# Patient Record
Sex: Female | Born: 1950 | Race: White | Hispanic: No | Marital: Married | Smoking: Current every day smoker
Health system: Southern US, Community
[De-identification: ages and names within clinical notes are randomized; demographics above are authoritative.]

## PROBLEM LIST (undated history)

## (undated) DIAGNOSIS — M2352 Chronic instability of knee, left knee: Secondary | ICD-10-CM

## (undated) DIAGNOSIS — M810 Age-related osteoporosis without current pathological fracture: Secondary | ICD-10-CM

## (undated) HISTORY — PX: TOTAL KNEE ARTHROPLASTY: SHX125

---

## 2013-09-11 HISTORY — PX: BACK SURGERY: SHX140

## 2014-02-27 ENCOUNTER — Emergency Department (HOSPITAL_COMMUNITY): Payer: Commercial Managed Care - PPO

## 2014-02-27 ENCOUNTER — Inpatient Hospital Stay (HOSPITAL_COMMUNITY)
Admission: EM | Admit: 2014-02-27 | Discharge: 2014-03-14 | DRG: 442 | Disposition: A | Discharge status: Skilled Nursing Facility | Payer: Commercial Managed Care - PPO | Attending: General Surgery | Admitting: General Surgery

## 2014-02-27 ENCOUNTER — Encounter (HOSPITAL_COMMUNITY): Payer: Self-pay | Admitting: Emergency Medicine

## 2014-02-27 DIAGNOSIS — F17210 Nicotine dependence, cigarettes, uncomplicated: Secondary | ICD-10-CM | POA: Diagnosis present

## 2014-02-27 DIAGNOSIS — B961 Klebsiella pneumoniae [K. pneumoniae] as the cause of diseases classified elsewhere: Secondary | ICD-10-CM | POA: Diagnosis not present

## 2014-02-27 DIAGNOSIS — R410 Disorientation, unspecified: Secondary | ICD-10-CM

## 2014-02-27 DIAGNOSIS — S36899A Unspecified injury of other intra-abdominal organs, initial encounter: Secondary | ICD-10-CM | POA: Diagnosis not present

## 2014-02-27 DIAGNOSIS — F10931 Alcohol use, unspecified with withdrawal delirium: Secondary | ICD-10-CM | POA: Diagnosis not present

## 2014-02-27 DIAGNOSIS — S2231XA Fracture of one rib, right side, initial encounter for closed fracture: Secondary | ICD-10-CM | POA: Diagnosis present

## 2014-02-27 DIAGNOSIS — F10231 Alcohol dependence with withdrawal delirium: Secondary | ICD-10-CM | POA: Diagnosis not present

## 2014-02-27 DIAGNOSIS — F10239 Alcohol dependence with withdrawal, unspecified: Secondary | ICD-10-CM | POA: Diagnosis not present

## 2014-02-27 DIAGNOSIS — S36116A Major laceration of liver, initial encounter: Secondary | ICD-10-CM

## 2014-02-27 DIAGNOSIS — Y92410 Unspecified street and highway as the place of occurrence of the external cause: Secondary | ICD-10-CM

## 2014-02-27 DIAGNOSIS — N390 Urinary tract infection, site not specified: Secondary | ICD-10-CM | POA: Diagnosis not present

## 2014-02-27 DIAGNOSIS — S2239XA Fracture of one rib, unspecified side, initial encounter for closed fracture: Secondary | ICD-10-CM | POA: Diagnosis not present

## 2014-02-27 DIAGNOSIS — S31109A Unspecified open wound of abdominal wall, unspecified quadrant without penetration into peritoneal cavity, initial encounter: Secondary | ICD-10-CM | POA: Diagnosis present

## 2014-02-27 DIAGNOSIS — S36892A Contusion of other intra-abdominal organs, initial encounter: Secondary | ICD-10-CM | POA: Diagnosis present

## 2014-02-27 DIAGNOSIS — F1010 Alcohol abuse, uncomplicated: Secondary | ICD-10-CM | POA: Diagnosis present

## 2014-02-27 DIAGNOSIS — S36113A Laceration of liver, unspecified degree, initial encounter: Principal | ICD-10-CM | POA: Diagnosis present

## 2014-02-27 DIAGNOSIS — S2232XA Fracture of one rib, left side, initial encounter for closed fracture: Secondary | ICD-10-CM | POA: Diagnosis present

## 2014-02-27 DIAGNOSIS — Z96652 Presence of left artificial knee joint: Secondary | ICD-10-CM | POA: Diagnosis present

## 2014-02-27 DIAGNOSIS — R4182 Altered mental status, unspecified: Secondary | ICD-10-CM | POA: Diagnosis not present

## 2014-02-27 DIAGNOSIS — R000 Tachycardia, unspecified: Secondary | ICD-10-CM | POA: Diagnosis present

## 2014-02-27 DIAGNOSIS — D62 Acute posthemorrhagic anemia: Secondary | ICD-10-CM | POA: Diagnosis not present

## 2014-02-27 DIAGNOSIS — S2243XA Multiple fractures of ribs, bilateral, initial encounter for closed fracture: Secondary | ICD-10-CM | POA: Diagnosis present

## 2014-02-27 DIAGNOSIS — R0781 Pleurodynia: Secondary | ICD-10-CM | POA: Diagnosis present

## 2014-02-27 DIAGNOSIS — S92002A Unspecified fracture of left calcaneus, initial encounter for closed fracture: Secondary | ICD-10-CM | POA: Diagnosis present

## 2014-02-27 HISTORY — DX: Chronic instability of knee, left knee: M23.52

## 2014-02-27 HISTORY — DX: Age-related osteoporosis without current pathological fracture: M81.0

## 2014-02-27 LAB — URINE MICROSCOPIC-ADD ON

## 2014-02-27 LAB — COMPREHENSIVE METABOLIC PANEL
ALT: 614 U/L — ABNORMAL HIGH (ref 0–35)
AST: 1028 U/L — AB (ref 0–37)
Albumin: 3.6 g/dL (ref 3.5–5.2)
Alkaline Phosphatase: 114 U/L (ref 39–117)
Anion gap: 13 (ref 5–15)
BUN: 8 mg/dL (ref 6–23)
CO2: 22 meq/L (ref 19–32)
Calcium: 7.9 mg/dL — ABNORMAL LOW (ref 8.4–10.5)
Chloride: 105 mEq/L (ref 96–112)
Creatinine, Ser: 0.5 mg/dL (ref 0.50–1.10)
Glucose, Bld: 126 mg/dL — ABNORMAL HIGH (ref 70–99)
Potassium: 4.1 mEq/L (ref 3.7–5.3)
Sodium: 140 mEq/L (ref 137–147)
Total Bilirubin: <0.2 mg/dL — ABNORMAL LOW (ref 0.3–1.2)
Total Protein: 6 g/dL (ref 6.0–8.3)

## 2014-02-27 LAB — URINALYSIS, ROUTINE W REFLEX MICROSCOPIC
Bilirubin Urine: NEGATIVE
Glucose, UA: NEGATIVE mg/dL
KETONES UR: NEGATIVE mg/dL
Leukocytes, UA: NEGATIVE
NITRITE: NEGATIVE
PROTEIN: NEGATIVE mg/dL
Specific Gravity, Urine: 1.024 (ref 1.005–1.030)
UROBILINOGEN UA: 0.2 mg/dL (ref 0.0–1.0)
pH: 5 (ref 5.0–8.0)

## 2014-02-27 LAB — CBC WITH DIFFERENTIAL/PLATELET
BASOS ABS: 0 10*3/uL (ref 0.0–0.1)
Basophils Relative: 0 % (ref 0–1)
EOS ABS: 0 10*3/uL (ref 0.0–0.7)
Eosinophils Relative: 0 % (ref 0–5)
HCT: 42.4 % (ref 36.0–46.0)
Hemoglobin: 14.7 g/dL (ref 12.0–15.0)
LYMPHS PCT: 13 % (ref 12–46)
Lymphs Abs: 1.7 10*3/uL (ref 0.7–4.0)
MCH: 31.3 pg (ref 26.0–34.0)
MCHC: 34.7 g/dL (ref 30.0–36.0)
MCV: 90.4 fL (ref 78.0–100.0)
MONO ABS: 0.7 10*3/uL (ref 0.1–1.0)
Monocytes Relative: 5 % (ref 3–12)
Neutro Abs: 11 10*3/uL — ABNORMAL HIGH (ref 1.7–7.7)
Neutrophils Relative %: 82 % — ABNORMAL HIGH (ref 43–77)
Platelets: 208 10*3/uL (ref 150–400)
RBC: 4.69 MIL/uL (ref 3.87–5.11)
RDW: 12.3 % (ref 11.5–15.5)
WBC: 13.4 10*3/uL — ABNORMAL HIGH (ref 4.0–10.5)

## 2014-02-27 LAB — CBC
HEMATOCRIT: 41.8 % (ref 36.0–46.0)
HEMOGLOBIN: 14.9 g/dL (ref 12.0–15.0)
MCH: 32 pg (ref 26.0–34.0)
MCHC: 35.6 g/dL (ref 30.0–36.0)
MCV: 89.7 fL (ref 78.0–100.0)
Platelets: 260 10*3/uL (ref 150–400)
RBC: 4.66 MIL/uL (ref 3.87–5.11)
RDW: 12.5 % (ref 11.5–15.5)
WBC: 13.2 10*3/uL — ABNORMAL HIGH (ref 4.0–10.5)

## 2014-02-27 LAB — PROTIME-INR
INR: 1.14 (ref 0.00–1.49)
PROTHROMBIN TIME: 14.6 s (ref 11.6–15.2)

## 2014-02-27 LAB — TYPE AND SCREEN
ABO/RH(D): O POS
Antibody Screen: NEGATIVE

## 2014-02-27 LAB — RAPID URINE DRUG SCREEN, HOSP PERFORMED
AMPHETAMINES: NOT DETECTED
BARBITURATES: NOT DETECTED
BENZODIAZEPINES: NOT DETECTED
Cocaine: NOT DETECTED
Opiates: POSITIVE — AB
Tetrahydrocannabinol: NOT DETECTED

## 2014-02-27 LAB — ABO/RH: ABO/RH(D): O POS

## 2014-02-27 LAB — ETHANOL: Alcohol, Ethyl (B): 155 mg/dL — ABNORMAL HIGH (ref 0–11)

## 2014-02-27 MED ORDER — INFLUENZA VAC SPLIT QUAD 0.5 ML IM SUSY
0.5000 mL | PREFILLED_SYRINGE | INTRAMUSCULAR | Status: AC
  Administered 2014-02-28: 0.5 mL via INTRAMUSCULAR
  Filled 2014-02-27: qty 0.5

## 2014-02-27 MED ORDER — THIAMINE HCL 100 MG/ML IJ SOLN
100.0000 mg | Freq: Every day | INTRAMUSCULAR | Status: DC
  Filled 2014-02-27 (×5): qty 1

## 2014-02-27 MED ORDER — PANTOPRAZOLE SODIUM 40 MG IV SOLR
40.0000 mg | Freq: Every day | INTRAVENOUS | Status: DC
  Administered 2014-02-27 – 2014-03-01 (×2): 40 mg via INTRAVENOUS
  Filled 2014-02-27 (×5): qty 40

## 2014-02-27 MED ORDER — IOHEXOL 300 MG/ML  SOLN
100.0000 mL | Freq: Once | INTRAMUSCULAR | Status: AC | PRN
  Administered 2014-02-27: 100 mL via INTRAVENOUS

## 2014-02-27 MED ORDER — HYDROMORPHONE HCL 1 MG/ML IJ SOLN
0.5000 mg | INTRAMUSCULAR | Status: DC | PRN
  Administered 2014-02-27 – 2014-03-01 (×10): 0.5 mg via INTRAVENOUS
  Filled 2014-02-27 (×10): qty 1

## 2014-02-27 MED ORDER — TETANUS-DIPHTH-ACELL PERTUSSIS 5-2.5-18.5 LF-MCG/0.5 IM SUSP
0.5000 mL | Freq: Once | INTRAMUSCULAR | Status: AC
  Administered 2014-02-27: 0.5 mL via INTRAMUSCULAR
  Filled 2014-02-27: qty 0.5

## 2014-02-27 MED ORDER — ONDANSETRON HCL 4 MG PO TABS
4.0000 mg | ORAL_TABLET | Freq: Four times a day (QID) | ORAL | Status: DC | PRN
Start: 2014-02-27 — End: 2014-03-14

## 2014-02-27 MED ORDER — VENLAFAXINE HCL 75 MG PO TABS
75.0000 mg | ORAL_TABLET | Freq: Every day | ORAL | Status: DC
  Administered 2014-02-28 – 2014-03-14 (×14): 75 mg via ORAL
  Filled 2014-02-27 (×15): qty 1

## 2014-02-27 MED ORDER — PANTOPRAZOLE SODIUM 40 MG PO TBEC
40.0000 mg | DELAYED_RELEASE_TABLET | Freq: Every day | ORAL | Status: DC
  Administered 2014-02-28 – 2014-03-14 (×13): 40 mg via ORAL
  Filled 2014-02-27 (×13): qty 1

## 2014-02-27 MED ORDER — KCL IN DEXTROSE-NACL 20-5-0.45 MEQ/L-%-% IV SOLN
INTRAVENOUS | Status: DC
Start: 2014-02-27 — End: 2014-02-28
  Administered 2014-02-27: 75 mL/h via INTRAVENOUS
  Administered 2014-02-28: 10:00:00 via INTRAVENOUS
  Filled 2014-02-27 (×4): qty 1000

## 2014-02-27 MED ORDER — OXYCODONE HCL 5 MG PO TABS
10.0000 mg | ORAL_TABLET | ORAL | Status: DC | PRN
  Administered 2014-02-27 – 2014-03-01 (×6): 10 mg via ORAL
  Filled 2014-02-27 (×6): qty 2

## 2014-02-27 MED ORDER — LORAZEPAM 1 MG PO TABS: 1.0000 mg | ORAL_TABLET | Freq: Four times a day (QID) | ORAL | Status: AC | PRN

## 2014-02-27 MED ORDER — ACETAMINOPHEN 325 MG PO TABS
650.0000 mg | ORAL_TABLET | ORAL | Status: DC | PRN
  Administered 2014-03-03 – 2014-03-13 (×3): 650 mg via ORAL
  Filled 2014-02-27 (×3): qty 2

## 2014-02-27 MED ORDER — MORPHINE SULFATE 4 MG/ML IJ SOLN
4.0000 mg | Freq: Once | INTRAMUSCULAR | Status: DC
  Filled 2014-02-27: qty 1

## 2014-02-27 MED ORDER — FOLIC ACID 1 MG PO TABS
1.0000 mg | ORAL_TABLET | Freq: Every day | ORAL | Status: DC
  Administered 2014-02-27 – 2014-03-14 (×15): 1 mg via ORAL
  Filled 2014-02-27 (×16): qty 1

## 2014-02-27 MED ORDER — HYDROMORPHONE HCL 1 MG/ML IJ SOLN
0.5000 mg | Freq: Once | INTRAMUSCULAR | Status: AC
  Administered 2014-02-27: 0.5 mg via INTRAVENOUS
  Filled 2014-02-27: qty 1

## 2014-02-27 MED ORDER — VITAMIN B-1 100 MG PO TABS
100.0000 mg | ORAL_TABLET | Freq: Every day | ORAL | Status: DC
  Administered 2014-02-27 – 2014-03-14 (×15): 100 mg via ORAL
  Filled 2014-02-27 (×16): qty 1

## 2014-02-27 MED ORDER — CETYLPYRIDINIUM CHLORIDE 0.05 % MT LIQD
7.0000 mL | Freq: Two times a day (BID) | OROMUCOSAL | Status: DC
  Administered 2014-02-27 – 2014-03-14 (×30): 7 mL via OROMUCOSAL

## 2014-02-27 MED ORDER — ONDANSETRON HCL 4 MG/2ML IJ SOLN: 4.0000 mg | Freq: Four times a day (QID) | INTRAMUSCULAR | Status: DC | PRN

## 2014-02-27 MED ORDER — LORAZEPAM 2 MG/ML IJ SOLN
0.0000 mg | Freq: Four times a day (QID) | INTRAMUSCULAR | Status: AC
  Administered 2014-02-27: 2 mg via INTRAVENOUS
  Administered 2014-02-28: 1 mg via INTRAVENOUS
  Administered 2014-02-28 (×2): 4 mg via INTRAVENOUS
  Administered 2014-03-01: 1 mg via INTRAVENOUS
  Administered 2014-03-01: 4 mg via INTRAVENOUS
  Filled 2014-02-27 (×3): qty 1
  Filled 2014-02-27: qty 2
  Filled 2014-02-27 (×2): qty 1

## 2014-02-27 MED ORDER — LORAZEPAM 2 MG/ML IJ SOLN
1.0000 mg | Freq: Four times a day (QID) | INTRAMUSCULAR | Status: AC | PRN
  Administered 2014-02-28 – 2014-03-02 (×6): 1 mg via INTRAVENOUS
  Filled 2014-02-27 (×7): qty 1

## 2014-02-27 MED ORDER — ONDANSETRON HCL 4 MG/2ML IJ SOLN
4.0000 mg | Freq: Once | INTRAMUSCULAR | Status: DC
  Filled 2014-02-27: qty 2

## 2014-02-27 MED ORDER — BUTALBITAL-APAP-CAFFEINE 50-325-40 MG PO TABS: 1.0000 | ORAL_TABLET | Freq: Two times a day (BID) | ORAL | Status: DC | PRN

## 2014-02-27 MED ORDER — ADULT MULTIVITAMIN W/MINERALS CH
1.0000 | ORAL_TABLET | Freq: Every day | ORAL | Status: DC
  Administered 2014-02-27 – 2014-03-14 (×14): 1 via ORAL
  Filled 2014-02-27 (×16): qty 1

## 2014-02-27 MED ORDER — LORAZEPAM 2 MG/ML IJ SOLN
0.0000 mg | Freq: Two times a day (BID) | INTRAMUSCULAR | Status: AC
  Administered 2014-03-01: 1 mg via INTRAVENOUS
  Administered 2014-03-02: 2 mg via INTRAVENOUS
  Administered 2014-03-03 (×2): 4 mg via INTRAVENOUS
  Filled 2014-02-27: qty 2
  Filled 2014-02-27 (×3): qty 1
  Filled 2014-02-27: qty 2

## 2014-02-27 MED ORDER — OXYCODONE HCL 5 MG PO TABS
5.0000 mg | ORAL_TABLET | ORAL | Status: DC | PRN
  Administered 2014-02-28: 5 mg via ORAL
  Filled 2014-02-27: qty 1
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
## 2014-02-27 NOTE — ED Notes (Signed)
Pt placed on 2L 02, pt's 02 sats reading 88% on RA.  
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
## 2014-02-27 NOTE — ED Notes (Signed)
Admitting MD at bedside.

## 2014-02-27 NOTE — Progress Notes (Signed)
Orthopedic Tech Progress Note Patient Details:  Brooke Estrada 1951/02/17 [id number]  Ortho Devices Type of Ortho Device: Ace wrap;Post (short leg) splint Ortho Device/Splint Location: lle Ortho Device/Splint Interventions: Application   Brooke Estrada 02/27/2014, 3:42 PM
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
## 2014-02-27 NOTE — ED Notes (Signed)
Pt unable to urinate at this time.  
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
## 2014-02-28 LAB — CBC
HCT: 44.9 % (ref 36.0–46.0)
HCT: 42.9 % (ref 36.0–46.0)
Hemoglobin: 15.3 g/dL — ABNORMAL HIGH (ref 12.0–15.0)
Hemoglobin: 14.7 g/dL (ref 12.0–15.0)
MCH: 31 pg (ref 26.0–34.0)
MCH: 32 pg (ref 26.0–34.0)
MCHC: 34.1 g/dL (ref 30.0–36.0)
MCHC: 34.3 g/dL (ref 30.0–36.0)
MCV: 91.1 fL (ref 78.0–100.0)
MCV: 93.3 fL (ref 78.0–100.0)
Platelets: 198 10*3/uL (ref 150–400)
Platelets: 166 10*3/uL (ref 150–400)
RBC: 4.93 MIL/uL (ref 3.87–5.11)
RBC: 4.6 MIL/uL (ref 3.87–5.11)
RDW: 12.2 % (ref 11.5–15.5)
RDW: 12.2 % (ref 11.5–15.5)
WBC: 14 10*3/uL — ABNORMAL HIGH (ref 4.0–10.5)
WBC: 19.3 10*3/uL — ABNORMAL HIGH (ref 4.0–10.5)

## 2014-02-28 LAB — BASIC METABOLIC PANEL WITH GFR
Anion gap: 10 (ref 5–15)
BUN: 5 mg/dL — ABNORMAL LOW (ref 6–23)
CO2: 27 meq/L (ref 19–32)
Calcium: 8 mg/dL — ABNORMAL LOW (ref 8.4–10.5)
Chloride: 96 meq/L (ref 96–112)
Creatinine, Ser: 0.45 mg/dL — ABNORMAL LOW (ref 0.50–1.10)
GFR calc Af Amer: >90 mL/min
GFR calc non Af Amer: >90 mL/min
Glucose, Bld: 128 mg/dL — ABNORMAL HIGH (ref 70–99)
Potassium: 4.2 meq/L (ref 3.7–5.3)
Sodium: 133 meq/L — ABNORMAL LOW (ref 137–147)

## 2014-02-28 LAB — PROTIME-INR
INR: 1.01 (ref 0.00–1.49)
Prothrombin Time: 13.3 s (ref 11.6–15.2)

## 2014-02-28 MED ORDER — POTASSIUM CHLORIDE 2 MEQ/ML IV SOLN
INTRAVENOUS | Status: DC
  Filled 2014-02-28 (×2): qty 1000

## 2014-02-28 MED ORDER — DEXMEDETOMIDINE HCL IN NACL 200 MCG/50ML IV SOLN
0.2000 ug/kg/h | INTRAVENOUS | Status: AC
  Administered 2014-02-28 (×3): 0.2 ug/kg/h via INTRAVENOUS
  Administered 2014-03-01: 0.108 ug/kg/h via INTRAVENOUS
  Filled 2014-02-28 (×3): qty 50

## 2014-02-28 MED ORDER — METOPROLOL TARTRATE 12.5 MG HALF TABLET
12.5000 mg | ORAL_TABLET | Freq: Two times a day (BID) | ORAL | Status: DC
  Administered 2014-02-28 – 2014-03-14 (×28): 12.5 mg via ORAL
  Filled 2014-02-28 (×31): qty 1

## 2014-02-28 MED ORDER — DEXMEDETOMIDINE HCL IN NACL 200 MCG/50ML IV SOLN
0.4000 ug/kg/h | INTRAVENOUS | Status: DC
  Filled 2014-02-28: qty 50

## 2014-02-28 NOTE — Progress Notes (Addendum)
UR completed. Pt has been evaluated by PT and has no HH recommendations. DME can be provided by [facility] once ordered.   Carlyle Lipa, RN BSN [facility] CCM Trauma/Neuro ICU Case Manager [phone]
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
## 2014-02-28 NOTE — Progress Notes (Signed)
Pt overdue for another CBC. Lab called, will come draw ASAP.
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
## 2014-02-28 NOTE — Progress Notes (Signed)
Patient ID: Brooke Estrada, female   DOB: [birth year], 63 y.o.   MRN: [id number] Worsening ETOH withdrawal. Sweating and hallucinations.Added low dose Precedex with good effect. I spoke with her two daughters and other family at the bedside. [name], MD, MPH, FACS Trauma: [phone] General Surgery: [phone]

## 2014-03-01 LAB — BASIC METABOLIC PANEL
ANION GAP: 10 (ref 5–15)
BUN: 10 mg/dL (ref 6–23)
CALCIUM: 8 mg/dL — AB (ref 8.4–10.5)
CO2: 25 mEq/L (ref 19–32)
CREATININE: 0.54 mg/dL (ref 0.50–1.10)
Chloride: 99 mEq/L (ref 96–112)
Glucose, Bld: 111 mg/dL — ABNORMAL HIGH (ref 70–99)
Potassium: 5.2 mEq/L (ref 3.7–5.3)
Sodium: 134 mEq/L — ABNORMAL LOW (ref 137–147)

## 2014-03-01 LAB — CBC
HCT: 39.9 % (ref 36.0–46.0)
HCT: 36 % (ref 36.0–46.0)
HEMOGLOBIN: 12.2 g/dL (ref 12.0–15.0)
Hemoglobin: 13.5 g/dL (ref 12.0–15.0)
MCH: 30.8 pg (ref 26.0–34.0)
MCH: 31.4 pg (ref 26.0–34.0)
MCHC: 33.8 g/dL (ref 30.0–36.0)
MCHC: 33.9 g/dL (ref 30.0–36.0)
MCV: 92.5 fL (ref 78.0–100.0)
MCV: 90.9 fL (ref 78.0–100.0)
PLATELETS: 150 10*3/uL (ref 150–400)
Platelets: 122 10*3/uL — ABNORMAL LOW (ref 150–400)
RBC: 4.39 MIL/uL (ref 3.87–5.11)
RBC: 3.89 MIL/uL (ref 3.87–5.11)
RDW: 12.1 % (ref 11.5–15.5)
RDW: 12.2 % (ref 11.5–15.5)
WBC: 12.2 10*3/uL — ABNORMAL HIGH (ref 4.0–10.5)
WBC: 16.5 10*3/uL — AB (ref 4.0–10.5)

## 2014-03-01 MED ORDER — TRAMADOL HCL 50 MG PO TABS
50.0000 mg | ORAL_TABLET | Freq: Four times a day (QID) | ORAL | Status: DC | PRN
  Administered 2014-03-01: 75 mg via ORAL
  Administered 2014-03-01 – 2014-03-02 (×2): 50 mg via ORAL
  Administered 2014-03-03 – 2014-03-05 (×4): 100 mg via ORAL
  Administered 2014-03-05 – 2014-03-06 (×2): 50 mg via ORAL
  Administered 2014-03-07 – 2014-03-14 (×12): 100 mg via ORAL
  Filled 2014-03-01 (×2): qty 2
  Filled 2014-03-01: qty 1
  Filled 2014-03-01 (×5): qty 2
  Filled 2014-03-01: qty 1
  Filled 2014-03-01 (×2): qty 2
  Filled 2014-03-01 (×2): qty 1
  Filled 2014-03-01: qty 2
  Filled 2014-03-01: qty 1
  Filled 2014-03-01 (×8): qty 2

## 2014-03-01 MED ORDER — DEXMEDETOMIDINE HCL IN NACL 200 MCG/50ML IV SOLN
0.2000 ug/kg/h | INTRAVENOUS | Status: AC
  Administered 2014-03-01: 0.2 ug/kg/h via INTRAVENOUS
  Administered 2014-03-02: 0.3 ug/kg/h via INTRAVENOUS
  Administered 2014-03-02: 0.2 ug/kg/h via INTRAVENOUS
  Administered 2014-03-03: 0.5 ug/kg/h via INTRAVENOUS
  Administered 2014-03-03: 0.3 ug/kg/h via INTRAVENOUS
  Filled 2014-03-01 (×5): qty 50

## 2014-03-01 MED ORDER — BETHANECHOL CHLORIDE 25 MG PO TABS
25.0000 mg | ORAL_TABLET | Freq: Four times a day (QID) | ORAL | Status: DC
Start: 2014-03-01 — End: 2014-03-13
  Administered 2014-03-01 – 2014-03-12 (×45): 25 mg via ORAL
  Filled 2014-03-01 (×52): qty 1

## 2014-03-01 MED ORDER — DEXTROSE-NACL 5-0.9 % IV SOLN
INTRAVENOUS | Status: DC
  Administered 2014-03-01 (×2): via INTRAVENOUS
  Administered 2014-03-02: 50 mL/h via INTRAVENOUS
  Administered 2014-03-02 – 2014-03-03 (×2): via INTRAVENOUS
  Administered 2014-03-04: 1000 mL via INTRAVENOUS
  Administered 2014-03-04 – 2014-03-05 (×2): via INTRAVENOUS
  Administered 2014-03-07: 50 mL/h via INTRAVENOUS
  Administered 2014-03-08 – 2014-03-10 (×3): via INTRAVENOUS

## 2014-03-01 MED ORDER — HYDROMORPHONE HCL 1 MG/ML IJ SOLN
0.5000 mg | INTRAMUSCULAR | Status: DC | PRN
Start: 2014-03-01 — End: 2014-03-04
  Administered 2014-03-01 – 2014-03-04 (×12): 0.5 mg via INTRAVENOUS
  Filled 2014-03-01 (×12): qty 1

## 2014-03-01 NOTE — Progress Notes (Signed)
Notified Trauma MD about patient's inability to void. I performed multiple bladder scans which I felt not to be accurate due to the patient's anatomy and the amount of intake she was receiving, and placed patient on the bed pan multiple times in an attempt for the patient to void. Patient also complained about having the urge to pee and being uncomfortable. MD ordered a cath and wanted it left in if output was greater than 300. The initial output was 375 ml. The catheter was left in as per MD order. Will continue to monitor.

## 2014-03-01 NOTE — Progress Notes (Signed)
ETOH withdraw an issue.  Cont ICU care.  Appears stable otherwise.

## 2014-03-01 NOTE — Progress Notes (Signed)
Pt restless and removes [place].
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
## 2014-03-02 LAB — COMPREHENSIVE METABOLIC PANEL
ALT: 163 U/L — ABNORMAL HIGH (ref 0–35)
AST: 134 U/L — AB (ref 0–37)
Albumin: 2.5 g/dL — ABNORMAL LOW (ref 3.5–5.2)
Alkaline Phosphatase: 93 U/L (ref 39–117)
Anion gap: 9 (ref 5–15)
BUN: 9 mg/dL (ref 6–23)
CALCIUM: 8.1 mg/dL — AB (ref 8.4–10.5)
CO2: 25 meq/L (ref 19–32)
CREATININE: 0.44 mg/dL — AB (ref 0.50–1.10)
Chloride: 105 mEq/L (ref 96–112)
GFR calc non Af Amer: >90 mL/min (ref >90)
GLUCOSE: 97 mg/dL (ref 70–99)
Potassium: 5.4 mEq/L — ABNORMAL HIGH (ref 3.7–5.3)
Sodium: 139 mEq/L (ref 137–147)
TOTAL PROTEIN: 5.6 g/dL — AB (ref 6.0–8.3)
Total Bilirubin: 0.6 mg/dL (ref 0.3–1.2)

## 2014-03-02 LAB — CBC
HCT: 30.8 % — ABNORMAL LOW (ref 36.0–46.0)
Hemoglobin: 10.5 g/dL — ABNORMAL LOW (ref 12.0–15.0)
MCH: 30.8 pg (ref 26.0–34.0)
MCHC: 34.1 g/dL (ref 30.0–36.0)
MCV: 90.3 fL (ref 78.0–100.0)
PLATELETS: 107 10*3/uL — AB (ref 150–400)
RBC: 3.41 MIL/uL — AB (ref 3.87–5.11)
RDW: 12.1 % (ref 11.5–15.5)
WBC: 8.7 10*3/uL (ref 4.0–10.5)

## 2014-03-02 MED ORDER — SODIUM CHLORIDE 0.9 % IV SOLN
500.0000 mL | Freq: Once | INTRAVENOUS | Status: AC
  Administered 2014-03-02: 500 mL via INTRAVENOUS
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
## 2014-03-02 NOTE — Plan of Care (Signed)
Problem: Phase I Progression Outcomes Goal: Voiding-avoid urinary catheter unless indicated Outcome: Progressing Foley for acute urinary retention, on urecholine & increasing IVFs for hydration

## 2014-03-03 LAB — CBC WITH DIFFERENTIAL/PLATELET
Basophils Absolute: 0 10*3/uL (ref 0.0–0.1)
Basophils Relative: 0 % (ref 0–1)
Eosinophils Absolute: 0.1 10*3/uL (ref 0.0–0.7)
Eosinophils Relative: 1 % (ref 0–5)
HCT: 27.6 % — ABNORMAL LOW (ref 36.0–46.0)
Hemoglobin: 9.5 g/dL — ABNORMAL LOW (ref 12.0–15.0)
LYMPHS ABS: 0.5 10*3/uL — AB (ref 0.7–4.0)
LYMPHS PCT: 8 % — AB (ref 12–46)
MCH: 31 pg (ref 26.0–34.0)
MCHC: 34.4 g/dL (ref 30.0–36.0)
MCV: 90.2 fL (ref 78.0–100.0)
Monocytes Absolute: 0.6 10*3/uL (ref 0.1–1.0)
Monocytes Relative: 8 % (ref 3–12)
NEUTROS PCT: 83 % — AB (ref 43–77)
Neutro Abs: 5.6 10*3/uL (ref 1.7–7.7)
Platelets: 104 10*3/uL — ABNORMAL LOW (ref 150–400)
RBC: 3.06 MIL/uL — AB (ref 3.87–5.11)
RDW: 12.2 % (ref 11.5–15.5)
WBC: 6.8 10*3/uL (ref 4.0–10.5)

## 2014-03-03 LAB — BASIC METABOLIC PANEL
ANION GAP: 11 (ref 5–15)
BUN: 7 mg/dL (ref 6–23)
CO2: 26 mEq/L (ref 19–32)
Calcium: 7.8 mg/dL — ABNORMAL LOW (ref 8.4–10.5)
Chloride: 102 mEq/L (ref 96–112)
Creatinine, Ser: 0.38 mg/dL — ABNORMAL LOW (ref 0.50–1.10)
GFR calc non Af Amer: >90 mL/min (ref >90)
Glucose, Bld: 113 mg/dL — ABNORMAL HIGH (ref 70–99)
POTASSIUM: 3.1 meq/L — AB (ref 3.7–5.3)
SODIUM: 139 meq/L (ref 137–147)

## 2014-03-03 LAB — CBC
HCT: 28.9 % — ABNORMAL LOW (ref 36.0–46.0)
Hemoglobin: 9.9 g/dL — ABNORMAL LOW (ref 12.0–15.0)
MCH: 30.9 pg (ref 26.0–34.0)
MCHC: 34.3 g/dL (ref 30.0–36.0)
MCV: 90.3 fL (ref 78.0–100.0)
Platelets: 107 10*3/uL — ABNORMAL LOW (ref 150–400)
RBC: 3.2 MIL/uL — AB (ref 3.87–5.11)
RDW: 12.2 % (ref 11.5–15.5)
WBC: 6.8 10*3/uL (ref 4.0–10.5)

## 2014-03-03 MED ORDER — DEXMEDETOMIDINE HCL IN NACL 200 MCG/50ML IV SOLN
0.2000 ug/kg/h | INTRAVENOUS | Status: AC
  Administered 2014-03-03: 0.2 ug/kg/h via INTRAVENOUS
  Administered 2014-03-04: 0.4 ug/kg/h via INTRAVENOUS
  Filled 2014-03-03 (×2): qty 50

## 2014-03-03 MED ORDER — POTASSIUM CHLORIDE CRYS ER 20 MEQ PO TBCR
20.0000 meq | EXTENDED_RELEASE_TABLET | Freq: Two times a day (BID) | ORAL | Status: AC
  Administered 2014-03-03 (×2): 20 meq via ORAL
  Filled 2014-03-03 (×2): qty 1

## 2014-03-03 NOTE — Progress Notes (Signed)
Chaplain visited with patient and family. Patient responded sometimes to prompting, but overall was tired. Daughter and husband identified that patient was more aware today than previous days. Family seemed reluctant to share concerns with chaplain. Will follow as needed.   03/03/14 1100  Clinical Encounter Type  Visited With Patient and family together  Visit Type Initial  Referral From Social work  Spiritual Encounters  Spiritual Needs Emotional  Stress Factors  Patient Stress Factors None identified  Family Stress Factors Family relationships;Major life changes  Charmian Muff, Chaplain 11:30 AM 03/03/2014
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
## 2014-03-03 NOTE — Progress Notes (Signed)
Pt yelling , HR 140s. Ativan given per order. Daughter on the way.

## 2014-03-04 ENCOUNTER — Inpatient Hospital Stay (HOSPITAL_COMMUNITY): Payer: Commercial Managed Care - PPO

## 2014-03-04 LAB — BASIC METABOLIC PANEL
Anion gap: 7 (ref 5–15)
BUN: 6 mg/dL (ref 6–23)
CHLORIDE: 105 meq/L (ref 96–112)
CO2: 30 mEq/L (ref 19–32)
CREATININE: 0.45 mg/dL — AB (ref 0.50–1.10)
Calcium: 8.1 mg/dL — ABNORMAL LOW (ref 8.4–10.5)
GFR calc non Af Amer: >90 mL/min (ref >90)
Glucose, Bld: 119 mg/dL — ABNORMAL HIGH (ref 70–99)
POTASSIUM: 3.5 meq/L — AB (ref 3.7–5.3)
SODIUM: 142 meq/L (ref 137–147)

## 2014-03-04 LAB — CBC
HCT: 28 % — ABNORMAL LOW (ref 36.0–46.0)
Hemoglobin: 9.6 g/dL — ABNORMAL LOW (ref 12.0–15.0)
MCH: 31 pg (ref 26.0–34.0)
MCHC: 34.3 g/dL (ref 30.0–36.0)
MCV: 90.3 fL (ref 78.0–100.0)
Platelets: 119 10*3/uL — ABNORMAL LOW (ref 150–400)
RBC: 3.1 MIL/uL — AB (ref 3.87–5.11)
RDW: 12.2 % (ref 11.5–15.5)
WBC: 6.9 10*3/uL (ref 4.0–10.5)

## 2014-03-04 LAB — POCT I-STAT 3, ART BLOOD GAS (G3+)
ACID-BASE EXCESS: 2 mmol/L (ref 0.0–2.0)
Bicarbonate: 27.2 meq/L — ABNORMAL HIGH (ref 20.0–24.0)
O2 SAT: 91 %
PO2 ART: 62 mmHg — AB (ref 80.0–100.0)
TCO2: 28 mmol/L (ref 0–100)
pCO2 arterial: 43.5 mmHg (ref 35.0–45.0)
pH, Arterial: 7.406 (ref 7.350–7.450)

## 2014-03-04 MED ORDER — FUROSEMIDE 10 MG/ML IJ SOLN: 10.0000 mg | Freq: Once | INTRAMUSCULAR | Status: AC

## 2014-03-04 MED ORDER — ALBUTEROL SULFATE (2.5 MG/3ML) 0.083% IN NEBU
2.5000 mg | INHALATION_SOLUTION | RESPIRATORY_TRACT | Status: DC | PRN
  Administered 2014-03-06: 2.5 mg via RESPIRATORY_TRACT
  Filled 2014-03-04: qty 3

## 2014-03-04 MED ORDER — LORAZEPAM 2 MG/ML IJ SOLN
1.0000 mg | INTRAMUSCULAR | Status: DC | PRN
  Administered 2014-03-04 – 2014-03-05 (×4): 2 mg via INTRAVENOUS
  Administered 2014-03-06 (×2): 1 mg via INTRAVENOUS
  Administered 2014-03-06 – 2014-03-10 (×13): 2 mg via INTRAVENOUS
  Administered 2014-03-10: 1 mg via INTRAVENOUS
  Administered 2014-03-11 (×2): 2 mg via INTRAVENOUS
  Filled 2014-03-04 (×23): qty 1

## 2014-03-04 MED ORDER — ALBUTEROL SULFATE (2.5 MG/3ML) 0.083% IN NEBU: 2.5000 mg | INHALATION_SOLUTION | RESPIRATORY_TRACT | Status: AC

## 2014-03-04 MED ORDER — HYDROMORPHONE HCL 1 MG/ML IJ SOLN
0.5000 mg | INTRAMUSCULAR | Status: DC | PRN
  Administered 2014-03-05: 1 mg via INTRAVENOUS
  Administered 2014-03-05: 0.5 mg via INTRAVENOUS
  Administered 2014-03-05 (×2): 1 mg via INTRAVENOUS
  Administered 2014-03-06: 0.5 mg via INTRAVENOUS
  Administered 2014-03-06 – 2014-03-13 (×21): 1 mg via INTRAVENOUS
  Filled 2014-03-04 (×29): qty 1

## 2014-03-04 MED ORDER — HYDROMORPHONE HCL 1 MG/ML IJ SOLN
INTRAMUSCULAR | Status: AC
  Filled 2014-03-04: qty 1

## 2014-03-04 MED ORDER — DEXMEDETOMIDINE HCL IN NACL 200 MCG/50ML IV SOLN
0.2000 ug/kg/h | INTRAVENOUS | Status: DC
  Administered 2014-03-04 (×2): 0.3 ug/kg/h via INTRAVENOUS
  Filled 2014-03-04: qty 50

## 2014-03-04 MED ORDER — IPRATROPIUM BROMIDE 0.02 % IN SOLN: 0.5000 mg | RESPIRATORY_TRACT | Status: DC | PRN

## 2014-03-04 MED ORDER — HYDROMORPHONE HCL 1 MG/ML IJ SOLN
0.5000 mg | INTRAMUSCULAR | Status: AC
  Administered 2014-03-04: 0.5 mg via INTRAVENOUS

## 2014-03-04 MED ORDER — ALBUTEROL SULFATE (2.5 MG/3ML) 0.083% IN NEBU
2.5000 mg | INHALATION_SOLUTION | RESPIRATORY_TRACT | Status: DC
  Administered 2014-03-04 – 2014-03-10 (×36): 2.5 mg via RESPIRATORY_TRACT
  Filled 2014-03-04 (×38): qty 3

## 2014-03-04 MED ORDER — ALBUTEROL SULFATE (2.5 MG/3ML) 0.083% IN NEBU
2.5000 mg | INHALATION_SOLUTION | RESPIRATORY_TRACT | Status: DC | PRN
  Administered 2014-03-04 (×2): 2.5 mg via RESPIRATORY_TRACT
  Filled 2014-03-04 (×2): qty 3

## 2014-03-04 MED ORDER — FUROSEMIDE 10 MG/ML IJ SOLN
INTRAMUSCULAR | Status: AC
  Administered 2014-03-04: 10 mg
  Filled 2014-03-04: qty 4
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
## 2014-03-04 NOTE — Progress Notes (Signed)
Patient is having labored breathing, tachypnea, diaphoretic. Pain medication, dilaudid, given at 1735, and 2 mg of Ativan given at 1803 with no change in condition. Dr. Dwain Sarna paged and ordered CXR, ABG, and breathing treatment to be given now. E-link notified and onboard. Dr. Sung Amabile is aware, has seen patient via in room camera, ordered 1 time dose of dilaudid of 0.5 mg to be given now. Will continue to monitor the patient. Susy Manor, RN

## 2014-03-04 NOTE — Progress Notes (Signed)
RT called to give PRN treatment due to wheezing. Patient had audible wheezing and labored breathing. Post treatment patient seemed to settle down. Told RN to call if needed another PRN around 5pm.
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
## 2014-03-04 NOTE — Progress Notes (Signed)
Trauma Service Note  Subjective: Patient is more responsive today than Monday.  Ate a little breakfast  Objective: Vital signs in last 24 hours: Temp:  [97.4 F (36.3 C)-101.8 F (38.8 C)] 98.4 F (36.9 C) (09/22 0746) Pulse Rate:  [30-108] 96 (09/22 0800) Resp:  [19-38] 31 (09/22 0800) BP: (85-132)/(46-83) 132/78 mmHg (09/22 0800) SpO2:  [88 %-100 %] 96 % (09/22 0800) Last BM Date: 02/27/14  Intake/Output from previous day: 09/21 0701 - 09/22 0700 In: 1282.1 [I.V.:1282.1] Out: 1050 [Urine:1050] Intake/Output this shift: Total I/O In: 55.9 [I.V.:55.9] Out: 35 [Urine:35]  General: Agitated on Precedex drip.  Still on CIWA protocol  Lungs: Clear, but increased respiratory rate.  Abd: Soft, benign  Extremities: Left foot in splint and not to have surgery right away.  Neuro: Agitated, not oriented.  Lab Results: CBC   Recent Labs  03/03/14 1315 03/04/14 0207  WBC 6.8 6.9  HGB 9.9* 9.6*  HCT 28.9* 28.0*  PLT 107* 119*   BMET  Recent Labs  03/03/14 1025 03/04/14 0207  NA 139 142  K 3.1* 3.5*  CL 102 105  CO2 26 30  GLUCOSE 113* 119*  BUN 7 6  CREATININE 0.38* 0.45*  CALCIUM 7.8* 8.1*   PT/INR No results found for this basename: LABPROT, INR,  in the last 72 hours ABG No results found for this basename: PHART, PCO2, PO2, HCO3,  in the last 72 hours  Studies/Results: No results found.  Anti-infectives: Anti-infectives   None      Assessment/Plan: s/p  Keep in unit for now. Renew CIWA protocol   LOS: 5 days   Marta Lamas. Gae Bon, MD, FACS [phone] Trauma Surgeon 03/04/2014

## 2014-03-04 NOTE — Progress Notes (Addendum)
RT called for PRN treatment at 1730. Patient still tachycardic(asymptamatic to treatment) , tachypneic, and labored breathing with wheezes after treatment. Asked RN to page MD. MD ordered ABG. Will continue to monitor.

## 2014-03-04 NOTE — Progress Notes (Signed)
Patient ID: Brooke Estrada, female   DOB: Mar 12, 1951, 63 y.o.   MRN: [id number] Tachypneic, maintaining sats, tachycardic after albuterol now, she is somewhat oriented. Apparently this has happened before according to nursing but this was not relayed to me.  Will restart her precedex as I think that is related, will check cxr, abg ok with low po2, will put on facemask as I think breathing through mouth also.

## 2014-03-05 ENCOUNTER — Inpatient Hospital Stay (HOSPITAL_COMMUNITY): Payer: Commercial Managed Care - PPO

## 2014-03-05 LAB — BLOOD GAS, ARTERIAL
Acid-Base Excess: 6 mmol/L — ABNORMAL HIGH (ref 0.0–2.0)
BICARBONATE: 30 meq/L — AB (ref 20.0–24.0)
Drawn by: 362771
FIO2: 0.35 %
O2 Saturation: 93.5 %
PO2 ART: 67.2 mmHg — AB (ref 80.0–100.0)
Patient temperature: 98.6
TCO2: 31.4 mmol/L (ref 0–100)
pCO2 arterial: 43.6 mmHg (ref 35.0–45.0)
pH, Arterial: 7.453 — ABNORMAL HIGH (ref 7.350–7.450)

## 2014-03-05 LAB — CBC WITH DIFFERENTIAL/PLATELET
BASOS ABS: 0 10*3/uL (ref 0.0–0.1)
BASOS PCT: 0 % (ref 0–1)
EOS ABS: 0 10*3/uL (ref 0.0–0.7)
Eosinophils Relative: 1 % (ref 0–5)
HEMATOCRIT: 28.9 % — AB (ref 36.0–46.0)
HEMOGLOBIN: 9.9 g/dL — AB (ref 12.0–15.0)
Lymphocytes Relative: 11 % — ABNORMAL LOW (ref 12–46)
Lymphs Abs: 0.9 10*3/uL (ref 0.7–4.0)
MCH: 30.7 pg (ref 26.0–34.0)
MCHC: 34.3 g/dL (ref 30.0–36.0)
MCV: 89.5 fL (ref 78.0–100.0)
MONO ABS: 1.5 10*3/uL — AB (ref 0.1–1.0)
MONOS PCT: 19 % — AB (ref 3–12)
Neutro Abs: 5.6 10*3/uL (ref 1.7–7.7)
Neutrophils Relative %: 70 % (ref 43–77)
Platelets: 175 10*3/uL (ref 150–400)
RBC: 3.23 MIL/uL — ABNORMAL LOW (ref 3.87–5.11)
RDW: 12.2 % (ref 11.5–15.5)
WBC: 8.1 10*3/uL (ref 4.0–10.5)

## 2014-03-05 LAB — TROPONIN I: Troponin I: <0.3 ng/mL (ref <0.30)

## 2014-03-05 NOTE — Progress Notes (Signed)
Chaplain followed up with pt and family. Pt was able to communicate clearer today. Husband appears to be more positive about wife's progress. He said that she "ate more [at lunch] than all day yesterday," and "wants to go home." He sees the desire for her to go home as a very positive step. Chaplain will follow up as needed.   03/05/14 1300  Clinical Encounter Type  Visited With Patient and family together  Visit Type Follow-up  Spiritual Encounters  Spiritual Needs Emotional  Stress Factors  Patient Stress Factors Exhausted  Family Stress Factors Health changes;Family relationships  Vraj Denardo, Loa Socks 03/05/2014 1:36 PM

## 2014-03-05 NOTE — Progress Notes (Signed)
Trauma Service Note  Subjective: Patient very agitated, but more alert and wanting to go home.  Has wrist restraints in place and mittens.  Objective: Vital signs in last 24 hours: Temp:  [97.5 F (36.4 C)-99.4 F (37.4 C)] 99.1 F (37.3 C) (09/23 0700) Pulse Rate:  [84-124] 103 (09/23 0900) Resp:  [18-39] 26 (09/23 0900) BP: (87-156)/(51-88) 105/55 mmHg (09/23 0900) SpO2:  [93 %-100 %] 96 % (09/23 0900) FiO2 (%):  [35 %] 35 % (09/23 0800) Last BM Date: 02/27/14  Intake/Output from previous day: 09/22 0701 - 09/23 0700 In: 1945.5 [P.O.:720; I.V.:1225.5] Out: 2245 [Urine:2245] Intake/Output this shift: Total I/O In: 100 [I.V.:100] Out: 150 [Urine:150]  General: Agitated, but more alert.  No acute distress.  Lungs: Clear to auscultation.  Abd: Benign  Extremities: No changes  Neuro: Agitated and disoriented.`  Lab Results: CBC   Recent Labs  03/04/14 0207 03/05/14 0253  WBC 6.9 8.1  HGB 9.6* 9.9*  HCT 28.0* 28.9*  PLT 119* 175   BMET  Recent Labs  03/03/14 1025 03/04/14 0207  NA 139 142  K 3.1* 3.5*  CL 102 105  CO2 26 30  GLUCOSE 113* 119*  BUN 7 6  CREATININE 0.38* 0.45*  CALCIUM 7.8* 8.1*   PT/INR No results found for this basename: LABPROT, INR,  in the last 72 hours ABG  Recent Labs  03/04/14 1850 03/05/14 0319  PHART 7.406 7.453*  HCO3 27.2* 30.0*    Studies/Results: Dg Chest Port 1 View  03/04/2014   CLINICAL DATA:  Diaphoretic, tachypnea  EXAM: PORTABLE CHEST - 1 VIEW  COMPARISON:  CT chest 02/27/2014  FINDINGS: Bilateral interstitial thickening. Low lung volumes. No pleural effusion or pneumothorax. Stable cardiomediastinal silhouette. Unremarkable osseous structures.  IMPRESSION: Bilateral interstitial thickening and prominence of the central pulmonary vasculature. Overall findings are concerning for mild pulmonary edema.   Electronically Signed   By: Elige Ko   On: 03/04/2014 19:34    Anti-infectives: Anti-infectives   None      Assessment/Plan: s/p  Advance diet PT and OT.  DC Precedex.  LOS: 6 days   Marta Lamas. Gae Bon, MD, FACS [phone] Trauma Surgeon 03/05/2014
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
## 2014-03-06 ENCOUNTER — Inpatient Hospital Stay (HOSPITAL_COMMUNITY): Payer: Commercial Managed Care - PPO
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
## 2014-03-06 NOTE — Progress Notes (Signed)
Chaplain followed up with pt and family. Chaplain met second daughter. Pt in pain so chaplain opted to let her rest. Will follow as needed.   03/06/14 1100  Clinical Encounter Type  Visited With Patient and family together  Visit Type Follow-up  Brooke Estrada, Barbette Hair, Chaplain 03/06/2014 11:02 AM
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
## 2014-03-06 NOTE — Progress Notes (Signed)
UR completed.  Lexxie Winberg, RN BSN MHA CCM Trauma/Neuro ICU Case Manager [phone]  

## 2014-03-06 NOTE — Progress Notes (Signed)
pts husband requested a PRN tx due tachypnea

## 2014-03-07 ENCOUNTER — Inpatient Hospital Stay (HOSPITAL_COMMUNITY): Payer: Commercial Managed Care - PPO

## 2014-03-07 ENCOUNTER — Encounter (HOSPITAL_COMMUNITY): Payer: Self-pay | Admitting: Neurology

## 2014-03-07 DIAGNOSIS — R404 Transient alteration of awareness: Secondary | ICD-10-CM

## 2014-03-07 DIAGNOSIS — S36115A Moderate laceration of liver, initial encounter: Secondary | ICD-10-CM

## 2014-03-07 DIAGNOSIS — R4182 Altered mental status, unspecified: Secondary | ICD-10-CM | POA: Diagnosis not present

## 2014-03-07 LAB — CBC
HCT: 29.6 % — ABNORMAL LOW (ref 36.0–46.0)
HEMOGLOBIN: 10 g/dL — AB (ref 12.0–15.0)
MCH: 30.5 pg (ref 26.0–34.0)
MCHC: 33.8 g/dL (ref 30.0–36.0)
MCV: 90.2 fL (ref 78.0–100.0)
Platelets: 373 10*3/uL (ref 150–400)
RBC: 3.28 MIL/uL — AB (ref 3.87–5.11)
RDW: 12.6 % (ref 11.5–15.5)
WBC: 13.6 10*3/uL — AB (ref 4.0–10.5)

## 2014-03-07 LAB — BASIC METABOLIC PANEL
ANION GAP: 12 (ref 5–15)
BUN: 7 mg/dL (ref 6–23)
CHLORIDE: 103 meq/L (ref 96–112)
CO2: 32 mEq/L (ref 19–32)
Calcium: 8.3 mg/dL — ABNORMAL LOW (ref 8.4–10.5)
Creatinine, Ser: 0.39 mg/dL — ABNORMAL LOW (ref 0.50–1.10)
GFR calc non Af Amer: >90 mL/min (ref >90)
Glucose, Bld: 128 mg/dL — ABNORMAL HIGH (ref 70–99)
POTASSIUM: 3 meq/L — AB (ref 3.7–5.3)
SODIUM: 147 meq/L (ref 137–147)

## 2014-03-07 MED ORDER — FUROSEMIDE 10 MG/ML IJ SOLN
20.0000 mg | Freq: Once | INTRAMUSCULAR | Status: AC
  Administered 2014-03-07: 20 mg via INTRAVENOUS
  Filled 2014-03-07: qty 2

## 2014-03-07 MED ORDER — POTASSIUM CHLORIDE CRYS ER 20 MEQ PO TBCR
20.0000 meq | EXTENDED_RELEASE_TABLET | Freq: Once | ORAL | Status: AC
  Administered 2014-03-07: 20 meq via ORAL
  Filled 2014-03-07: qty 1

## 2014-03-07 MED ORDER — POTASSIUM CHLORIDE CRYS ER 20 MEQ PO TBCR
20.0000 meq | EXTENDED_RELEASE_TABLET | Freq: Two times a day (BID) | ORAL | Status: AC
  Administered 2014-03-07 (×2): 20 meq via ORAL
  Filled 2014-03-07 (×2): qty 1

## 2014-03-07 MED ORDER — SPIRITUS FRUMENTI
1.0000 | Freq: Two times a day (BID) | ORAL | Status: DC
  Administered 2014-03-07 – 2014-03-11 (×2): 1 via ORAL
  Filled 2014-03-07 (×13): qty 1

## 2014-03-07 MED ORDER — ENSURE COMPLETE PO LIQD
237.0000 mL | Freq: Three times a day (TID) | ORAL | Status: DC
  Administered 2014-03-08 – 2014-03-14 (×12): 237 mL via ORAL

## 2014-03-07 NOTE — Progress Notes (Signed)
At 07:38, pt was extremely agitated, RR in 50s, inconsolable. Ativan and dilaudid given.
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
## 2014-03-07 NOTE — Progress Notes (Signed)
SLP Cancellation Note  Patient Details Name: Brooke Estrada MRN: [id number] DOB: 1950/12/10   Cancelled treatment:       Reason Eval/Treat Not Completed: Other (comment). Pt asleep upon SLP arrival to assess tolerance and readiness for solid advancement. Per RN, pt's cognition and fluctuating RR continues to impact her ability to safely take in PO this morning, and would likely not be ready to advance her solids at this time. Per discussion with RN, will hold off at this time to let patient rest; SLP reviewed pt's risk factors.    Maxcine Ham, M.A. CCC-SLP [phone]  Maxcine Ham 03/07/2014, 2:01 PM
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
## 2014-03-07 NOTE — Clinical Documentation Improvement (Signed)
Abnormal Lab and/or Testing Results: Normal Potassium 3.7-5.3 9/18 = 4.2 9/19 = 5.2 9/20=5.4 9/21=3.1 9/22=3.5 9/25= 3.0   Treatment provided: 9/25 progress note:FEN - encourage PO, replace K Monitored BMP Potassium chloride SA bid starting 9/25  Possible Clinical Conditions:  Hypokalemia Other Unable to determine  Thank you, Cam Harnden T. Luiz Ochoa, MSN, MBA/MHA Clinical Documentation Specialist [email or web address] Office # [phone]

## 2014-03-07 NOTE — Progress Notes (Signed)
Pt. Refused to finish her breathing tx. Pt. Pulled her mask off. RN notified.
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
## 2014-03-08 LAB — CBC
HCT: 30.2 % — ABNORMAL LOW (ref 36.0–46.0)
HEMOGLOBIN: 10.2 g/dL — AB (ref 12.0–15.0)
MCH: 30.7 pg (ref 26.0–34.0)
MCHC: 33.8 g/dL (ref 30.0–36.0)
MCV: 91 fL (ref 78.0–100.0)
Platelets: 473 10*3/uL — ABNORMAL HIGH (ref 150–400)
RBC: 3.32 MIL/uL — ABNORMAL LOW (ref 3.87–5.11)
RDW: 12.8 % (ref 11.5–15.5)
WBC: 16.4 10*3/uL — ABNORMAL HIGH (ref 4.0–10.5)

## 2014-03-08 LAB — BASIC METABOLIC PANEL
ANION GAP: 10 (ref 5–15)
BUN: 7 mg/dL (ref 6–23)
CHLORIDE: 101 meq/L (ref 96–112)
CO2: 35 mEq/L — ABNORMAL HIGH (ref 19–32)
CREATININE: 0.44 mg/dL — AB (ref 0.50–1.10)
Calcium: 8.6 mg/dL (ref 8.4–10.5)
Glucose, Bld: 125 mg/dL — ABNORMAL HIGH (ref 70–99)
Potassium: 3 mEq/L — ABNORMAL LOW (ref 3.7–5.3)
Sodium: 146 mEq/L (ref 137–147)

## 2014-03-08 LAB — CORTISOL: CORTISOL PLASMA: 29.7 ug/dL

## 2014-03-08 LAB — VITAMIN B12: Vitamin B-12: 1161 pg/mL — ABNORMAL HIGH (ref 211–911)

## 2014-03-08 LAB — TSH: TSH: 2.08 u[IU]/mL (ref 0.350–4.500)

## 2014-03-08 MED ORDER — POTASSIUM CHLORIDE CRYS ER 20 MEQ PO TBCR: 20.0000 meq | EXTENDED_RELEASE_TABLET | Freq: Two times a day (BID) | ORAL | Status: DC

## 2014-03-08 MED ORDER — FUROSEMIDE 10 MG/ML IJ SOLN
20.0000 mg | Freq: Once | INTRAMUSCULAR | Status: AC
  Administered 2014-03-08: 20 mg via INTRAVENOUS

## 2014-03-08 MED ORDER — FUROSEMIDE 10 MG/ML IJ SOLN
INTRAMUSCULAR | Status: AC
  Filled 2014-03-08: qty 4

## 2014-03-08 MED ORDER — POTASSIUM CHLORIDE CRYS ER 20 MEQ PO TBCR
20.0000 meq | EXTENDED_RELEASE_TABLET | Freq: Three times a day (TID) | ORAL | Status: DC
  Administered 2014-03-08 – 2014-03-11 (×12): 20 meq via ORAL
  Filled 2014-03-08 (×15): qty 1
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
## 2014-03-09 LAB — CBC
HCT: 28.4 % — ABNORMAL LOW (ref 36.0–46.0)
Hemoglobin: 9.2 g/dL — ABNORMAL LOW (ref 12.0–15.0)
MCH: 30 pg (ref 26.0–34.0)
MCHC: 32.4 g/dL (ref 30.0–36.0)
MCV: 92.5 fL (ref 78.0–100.0)
PLATELETS: 507 10*3/uL — AB (ref 150–400)
RBC: 3.07 MIL/uL — AB (ref 3.87–5.11)
RDW: 13.1 % (ref 11.5–15.5)
WBC: 12.7 10*3/uL — ABNORMAL HIGH (ref 4.0–10.5)

## 2014-03-09 LAB — BASIC METABOLIC PANEL
ANION GAP: 8 (ref 5–15)
BUN: 8 mg/dL (ref 6–23)
CO2: 34 mEq/L — ABNORMAL HIGH (ref 19–32)
Calcium: 8.2 mg/dL — ABNORMAL LOW (ref 8.4–10.5)
Chloride: 103 mEq/L (ref 96–112)
Creatinine, Ser: 0.37 mg/dL — ABNORMAL LOW (ref 0.50–1.10)
Glucose, Bld: 130 mg/dL — ABNORMAL HIGH (ref 70–99)
POTASSIUM: 3.6 meq/L — AB (ref 3.7–5.3)
Sodium: 145 mEq/L (ref 137–147)

## 2014-03-09 MED ORDER — POTASSIUM CHLORIDE CRYS ER 20 MEQ PO TBCR: 40.0000 meq | EXTENDED_RELEASE_TABLET | Freq: Two times a day (BID) | ORAL | Status: DC
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
## 2014-03-10 ENCOUNTER — Inpatient Hospital Stay (HOSPITAL_COMMUNITY): Payer: Commercial Managed Care - PPO

## 2014-03-10 NOTE — Progress Notes (Signed)
Subjective: Patient continues to slowly improve.  Patient working with therapy this morning.  Continues to require reorientation but knows her allergies, details about her grandson, etc.    Objective: Current vital signs: BP 144/80  Pulse 103  Temp(Src) 98 F (36.7 C) (Oral)  Resp 33  Ht  (1.575 m)  Wt 62.2 kg (137 lb 2 oz)  BMI 25.07 kg/m2  SpO2 97% Vital signs in last 24 hours: Temp:  [97.6 F (36.4 C)-99.4 F (37.4 C)] 98 F (36.7 C) (09/28 0800) Pulse Rate:  [91-109] 103 (09/28 0700) Resp:  [19-45] 33 (09/28 0700) BP: (128-156)/(66-101) 144/80 mmHg (09/28 0751) SpO2:  [93 %-98 %] 97 % (09/28 0931) Weight:  [62.2 kg (137 lb 2 oz)] 62.2 kg (137 lb 2 oz) (09/28 0400)  Intake/Output from previous day: 09/27 0701 - 09/28 0700 In: 1290 [P.O.:90; I.V.:1200] Out: 2625 [Urine:2625] Intake/Output this shift:   Nutritional status: Dysphagia  Neurologic Exam: Mental Status:  Alert, requires reorientation, less anxious. Speech fluent without aphasia. Able to follow 3 step commands without difficulty.  Cranial Nerves:  II: Discs flat bilaterally; Visual fields grossly normal, pupils equal, round, reactive to light and accommodation  III,IV, VI: ptosis not present, extra-ocular motions intact bilaterally  V,VII: smile symmetric, facial light touch sensation normal bilaterally  VIII: hearing normal bilaterally  IX,X: gag reflex present  XI: bilateral shoulder shrug  XII: midline tongue extension without atrophy or fasciculations  Motor:  Although bradykinetic moves all extremities against gravity Sensory: Pinprick and light touch intact throughout, bilaterally  Deep Tendon Reflexes:  2+ in the upper extremities and 1+ at the knees  Plantars:  Right: downgoing   Left: in cast   Lab Results: Basic Metabolic Panel:  Recent Labs Lab 03/04/14 0207 03/07/14 0433 03/08/14 0258 03/09/14 0253  NA 142 147 146 145  K 3.5* 3.0* 3.0* 3.6*  CL 105 103 101 103  CO2 30 32  35* 34*  GLUCOSE 119* 128* 125* 130*  BUN CREATININE 0.45* 0.39* 0.44* 0.37*  CALCIUM 8.1* 8.3* 8.6 8.2*    Liver Function Tests: No results found for this basename: AST, ALT, ALKPHOS, BILITOT, PROT, ALBUMIN,  in the last 168 hours No results found for this basename: LIPASE, AMYLASE,  in the last 168 hours No results found for this basename: AMMONIA,  in the last 168 hours  CBC:  Recent Labs Lab 03/04/14 0207 03/05/14 0253 03/07/14 0433 03/08/14 0258 03/09/14 0253  WBC 6.9 8.1 13.6* 16.4* 12.7*  NEUTROABS  --  5.6  --   --   --   HGB 9.6* 9.9* 10.0* 10.2* 9.2*  HCT 28.0* 28.9* 29.6* 30.2* 28.4*  MCV 90.3 89.5 90.2 91.0 92.5  PLT 119* 175 373 473* 507*    Cardiac Enzymes:  Recent Labs Lab 03/05/14 0341  TROPONINI <0.30    Lipid Panel: No results found for this basename: CHOL, TRIG, HDL, CHOLHDL, VLDL, LDLCALC,  in the last 168 hours  CBG: No results found for this basename: GLUCAP,  in the last 168 hours  Microbiology: Results for orders placed during the hospital encounter of 02/27/14  URINE CULTURE     Status: None   Collection Time    03/08/14 11:55 AM      Result Value Ref Range Status   Specimen Description URINE, CATHETERIZED   Final   Special Requests Normal   Final   Culture  Setup Time     Final   Value: 03/08/2014  17:48     Performed at [facility] Count     Final   Value: >=100,000 COLONIES/ML     Performed at [facility]   Culture     Final   Value: GRAM NEGATIVE RODS     Performed at [facility]   Report Status PENDING   Incomplete    Coagulation Studies: No results found for this basename: LABPROT, INR,  in the last 72 hours  Imaging: No results found.  Medications:  I have reviewed the patient's current medications. Scheduled: . albuterol  2.5 mg Nebulization Q4H  . antiseptic oral rinse  7 mL Mouth Rinse BID  . bethanechol  25 mg Oral QID  . feeding supplement (ENSURE COMPLETE)  237  mL Oral TID WC  . folic acid  1 mg Oral Daily  . metoprolol tartrate  12.5 mg Oral BID  . multivitamin with minerals  1 tablet Oral Daily  . pantoprazole  40 mg Oral Daily  . potassium chloride  20 mEq Oral TID  . spiritus frumenti  1 each Oral BID  . thiamine  100 mg Oral Daily  . venlafaxine  75 mg Oral Daily    Assessment/Plan: Patient continues to slowly improve.  Patient's mental status likely multifactorial, related to withdrawal, medications, concussion, infection, etc. AM Cortisol elevated.    Recommendations: 1.  Agree with current management.     LOS: 11 days   Thana Farr, MD [facility] [phone] 03/10/2014  10:29 AM
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
## 2014-03-10 NOTE — Progress Notes (Signed)
Chaplain continues to follow up with family. Family seems positive at the possibility of moving to a step-down unit. Chaplain will follow as needed.  03/10/14 1600  Clinical Encounter Type  Visited With Family  Visit Type Follow-up  Spiritual Encounters  Spiritual Needs Emotional  Stress Factors  Family Stress Factors Major life changes  Natlie Asfour, Mayer Masker, Chaplain 03/10/2014 4:55 PM
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
## 2014-03-10 NOTE — Clinical Social Work Note (Signed)
Clinical Social Work Department BRIEF PSYCHOSOCIAL ASSESSMENT 03/10/2014  Patient:  Brooke Estrada, Brooke Estrada     Account Number:  [id number]     Admit date:  02/27/2014  Clinical Social Worker:  Verl Blalock  Date/Time:  03/10/2014 10:00 AM  Referred by:  Physician  Date Referred:  03/10/2014 Referred for  SNF Placement  Substance Abuse   Other Referral:   Interview type:  Family Other interview type:   Patient oriented to self only at this time - spoke with patient husband at bedside    PSYCHOSOCIAL DATA Living Status:  HUSBAND Admitted from facility:   Level of care:   Primary support name:  John, Vasconcelos   [phone] Primary support relationship to patient:  SPOUSE Degree of support available:   Strong    CURRENT CONCERNS Current Concerns  Post-Acute Placement  Substance Abuse   Other Concerns:    SOCIAL WORK ASSESSMENT / PLAN Clinical Social Worker spoke with patient husband over the phone to offer support and discuss patient needs at discharge.  Patient husband states that patient lives at home with him and he would like for her to return at discharge.  Patient states that he will not be able to manage patient at home until she is at a walker level - home not wheelchair accessible.  Patient husband is agreeable with SNF placement prior to return home.  Patient husband requests placement in [place] area if possible.  CSW initiated SNF search and will follow up with patient husband regarding bed offers.    Clinical Social Worker inquired about current substance use.  Patient is a chronic alcohol user, however not oriented enough at this time to benefit from SBIRT completion.  CSW remains available for support and to facilitate patient discharge needs once medically ready.   Assessment/plan status:  Psychosocial Support/Ongoing Assessment of Needs Other assessment/ plan:   Information/referral to community resources:   Clinical Social Worker offered to leave  facility list at patient bedside, however patient husband declined.    PATIENT'S/FAMILY'S RESPONSE TO PLAN OF CARE: Patient alert to person only at this time.  Patient husband available at bedside intermittently but was available by phone.  Patient with good family support available as needed.  Patient husband agreeable with SNF placement if needed prior to discharge home.  Patient husband verbalized understanding of CSW role and appreciation for support.

## 2014-03-10 NOTE — Progress Notes (Signed)
Rehab Admissions Coordinator Note:  Patient was screened by Trish Mage for appropriateness for an Inpatient Acute Rehab Consult.  At this time, we are recommending Skilled Nursing Facility.  Trish Mage 03/10/2014, 12:28 PM  I can be reached at [phone].

## 2014-03-10 NOTE — Progress Notes (Signed)
Trauma Service Note  Subjective: Patient is more conversant today than before.  Still seems a bit disoriented.    Objective: Vital signs in last 24 hours: Temp:  [97.6 F (36.4 C)-99.4 F (37.4 C)] 98 F (36.7 C) (09/28 0800) Pulse Rate:  [91-109] 103 (09/28 0700) Resp:  [19-45] 33 (09/28 0700) BP: (120-156)/(66-101) 144/80 mmHg (09/28 0751) SpO2:  [93 %-98 %] 95 % (09/28 0700) Weight:  [62.2 kg (137 lb 2 oz)] 62.2 kg (137 lb 2 oz) (09/28 0400) Last BM Date: 02/27/14  Intake/Output from previous day: 09/27 0701 - 09/28 0700 In: 1290 [P.O.:90; I.V.:1200] Out: 2625 [Urine:2625] Intake/Output this shift:    General: No acute distress.  Lungs: Clear.  Paradoxical movement of her sternal area.  Moves air well.  Sats are good on 4 L.  Abd: Soft, good bowel sounds.  Eating a fair amount  Extremities: No changes  Neuro: Confusion but more alert than before.  Lab Results: CBC   Recent Labs  03/08/14 0258 03/09/14 0253  WBC 16.4* 12.7*  HGB 10.2* 9.2*  HCT 30.2* 28.4*  PLT 473* 507*   BMET  Recent Labs  03/08/14 0258 03/09/14 0253  NA 146 145  K 3.0* 3.6*  CL 101 103  CO2 35* 34*  GLUCOSE 125* 130*  BUN 7 8  CREATININE 0.44* 0.37*  CALCIUM 8.6 8.2*   PT/INR No results found for this basename: LABPROT, INR,  in the last 72 hours ABG No results found for this basename: PHART, PCO2, PO2, HCO3,  in the last 72 hours  Studies/Results: No results found.  Anti-infectives: Anti-infectives   None      Assessment/Plan: s/p  d/c foley Advance diet  LOS: 11 days   Marta Lamas. Gae Bon, MD, FACS [phone] Trauma Surgeon 03/10/2014

## 2014-03-10 NOTE — Procedures (Signed)
Supervised and reviewed by Amanii Snethen MA CCC-SLP  

## 2014-03-10 NOTE — Progress Notes (Signed)
OT Cancellation Note  Patient Details Name: Brooke Estrada MRN: [id number] DOB: 1950-12-31   Cancelled Treatment:    Reason Eval/Treat Not Completed: Patient at procedure or test/ unavailable (MBS)  [facility] [place] Volante, OTR/L  [phone] 03/10/2014 03/10/2014, 1:15 PM

## 2014-03-10 NOTE — Progress Notes (Signed)
Report called to 3S xray also called coming up to get patient.i transfer patient to 3S from xray

## 2014-03-10 NOTE — Clinical Social Work Placement (Addendum)
Clinical Social Work Department CLINICAL SOCIAL WORK PLACEMENT NOTE 03/10/2014  Patient:  Brooke Estrada, Brooke Estrada  Account Number:  [id number] Admit date:  02/27/2014  Clinical Social Worker:  Macario Golds, LCSW  Date/time:  03/10/2014 10:00 AM  Clinical Social Work is seeking post-discharge placement for this patient at the following level of care:   SKILLED NURSING   (*CSW will update this form in Epic as items are completed)   03/10/2014  Patient/family provided with [facility] Department of Clinical Social Work's list of facilities offering this level of care within the geographic area requested by the patient (or if unable, by the patient's family).  03/10/2014  Patient/family informed of their freedom to choose among providers that offer the needed level of care, that participate in Medicare, Medicaid or managed care program needed by the patient, have an available bed and are willing to accept the patient.  03/10/2014  Patient/family informed of [facility]' ownership interest in [facility], as well as of the fact that they are under no obligation to receive care at this facility.  PASARR submitted to EDS on 03/13/14 [facility] [place], [place], [phone]) PASARR number received on 03/13/14  [facility] [place], [place], [phone])  FL2 transmitted to all facilities in geographic area requested by pt/family on  03/10/2014 FL2 transmitted to all facilities within larger geographic area on   Patient informed that his/her managed care company has contracts with or will negotiate with  certain facilities, including the following:     Patient/family informed of bed offers received:   Patient chooses bed at [facility]  ([address] [place], [place], [phone]) Physician recommends and patient chooses bed at    Patient to be transferred to [facility] on  03/14/14   [facility] [place], [place], [phone]) Patient to be transferred to facility by [facility] Patient and family notified of  transfer on 03/14/14 Name of family member notified:  Mr. Humm  The following physician request were entered in Epic:   Additional Comments:   Merlyn Lot, [facility] Clinical Social Worker [phone]
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
## 2014-03-11 LAB — CBC
HCT: 31.4 % — ABNORMAL LOW (ref 36.0–46.0)
HEMOGLOBIN: 10.4 g/dL — AB (ref 12.0–15.0)
MCH: 30.4 pg (ref 26.0–34.0)
MCHC: 33.1 g/dL (ref 30.0–36.0)
MCV: 91.8 fL (ref 78.0–100.0)
Platelets: 692 10*3/uL — ABNORMAL HIGH (ref 150–400)
RBC: 3.42 MIL/uL — ABNORMAL LOW (ref 3.87–5.11)
RDW: 12.9 % (ref 11.5–15.5)
WBC: 11.5 10*3/uL — ABNORMAL HIGH (ref 4.0–10.5)

## 2014-03-11 LAB — BASIC METABOLIC PANEL
Anion gap: 8 (ref 5–15)
BUN: 5 mg/dL — ABNORMAL LOW (ref 6–23)
CALCIUM: 8.5 mg/dL (ref 8.4–10.5)
CO2: 31 mEq/L (ref 19–32)
CREATININE: 0.41 mg/dL — AB (ref 0.50–1.10)
Chloride: 97 mEq/L (ref 96–112)
GFR calc Af Amer: >90 mL/min (ref >90)
GLUCOSE: 112 mg/dL — AB (ref 70–99)
Potassium: 4.3 mEq/L (ref 3.7–5.3)
Sodium: 136 mEq/L — ABNORMAL LOW (ref 137–147)

## 2014-03-11 LAB — URINE CULTURE: Special Requests: NORMAL

## 2014-03-11 MED ORDER — ALBUTEROL SULFATE (2.5 MG/3ML) 0.083% IN NEBU
2.5000 mg | INHALATION_SOLUTION | Freq: Three times a day (TID) | RESPIRATORY_TRACT | Status: DC
  Administered 2014-03-11 (×2): 2.5 mg via RESPIRATORY_TRACT
  Filled 2014-03-11 (×2): qty 3

## 2014-03-11 MED ORDER — CIPROFLOXACIN IN D5W 400 MG/200ML IV SOLN
400.0000 mg | Freq: Two times a day (BID) | INTRAVENOUS | Status: DC
  Administered 2014-03-11 – 2014-03-12 (×4): 400 mg via INTRAVENOUS
  Filled 2014-03-11 (×8): qty 200

## 2014-03-11 MED ORDER — DOCUSATE SODIUM 100 MG PO CAPS
100.0000 mg | ORAL_CAPSULE | Freq: Two times a day (BID) | ORAL | Status: DC
  Administered 2014-03-11 – 2014-03-14 (×6): 100 mg via ORAL
  Filled 2014-03-11 (×7): qty 1

## 2014-03-11 MED ORDER — POLYETHYLENE GLYCOL 3350 17 G PO PACK
17.0000 g | PACK | Freq: Every day | ORAL | Status: DC
  Administered 2014-03-13 – 2014-03-14 (×2): 17 g via ORAL
  Filled 2014-03-11 (×4): qty 1

## 2014-03-11 MED ORDER — LORAZEPAM 2 MG/ML IJ SOLN
2.0000 mg | INTRAMUSCULAR | Status: DC | PRN
  Administered 2014-03-11: 2 mg via INTRAVENOUS
  Filled 2014-03-11: qty 1

## 2014-03-11 MED ORDER — SPIRITUS FRUMENTI
1.0000 | Freq: Three times a day (TID) | ORAL | Status: DC
  Administered 2014-03-11 – 2014-03-12 (×2): 1 via ORAL
  Filled 2014-03-11 (×18): qty 1

## 2014-03-11 NOTE — Progress Notes (Signed)
UR completed.  Cindel Daugherty, RN BSN M[facility] CCM Trauma/Neuro ICU Case Manager [phone]  
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
## 2014-03-11 NOTE — Progress Notes (Signed)
Pt given prescribed can of beer per MD orders after pt stating that she would like to have the beer. Pt stated that she "likes to have 1-2 beers everyday but husband does not let her". Pt's husband entered room while pt was drinking prescribed can of beer. Husband told this RN that pt does not want beer and told pt "you don't want that." Pt stopped drinking beer and did not finish, Earney HamburgMichael Jeffrey PA aware, pt and pt's husband educated on the importance of preventing alcohol withdrawal. Pt's husband requested to discuss this w/the physician, Earney HamburgMichael Jeffrey notified, he or Dr. Lindie SpruceWyatt will be up to talk with pt's husband when one of them is available. No s/s of acute distress noted, VS stable.

## 2014-03-11 NOTE — Progress Notes (Signed)
Supervised and reviewed by Kerina Simoneau MA CCC-SLP  
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
## 2014-03-12 ENCOUNTER — Encounter (HOSPITAL_COMMUNITY): Payer: Self-pay | Admitting: Physical Medicine and Rehabilitation

## 2014-03-12 DIAGNOSIS — F10231 Alcohol dependence with withdrawal delirium: Secondary | ICD-10-CM | POA: Diagnosis not present

## 2014-03-12 DIAGNOSIS — F10931 Alcohol use, unspecified with withdrawal delirium: Secondary | ICD-10-CM | POA: Diagnosis not present

## 2014-03-12 DIAGNOSIS — G43909 Migraine, unspecified, not intractable, without status migrainosus: Secondary | ICD-10-CM | POA: Insufficient documentation

## 2014-03-12 DIAGNOSIS — K219 Gastro-esophageal reflux disease without esophagitis: Secondary | ICD-10-CM | POA: Insufficient documentation

## 2014-03-12 DIAGNOSIS — S2243XA Multiple fractures of ribs, bilateral, initial encounter for closed fracture: Secondary | ICD-10-CM | POA: Diagnosis present

## 2014-03-12 DIAGNOSIS — S92002A Unspecified fracture of left calcaneus, initial encounter for closed fracture: Secondary | ICD-10-CM | POA: Diagnosis present

## 2014-03-12 DIAGNOSIS — N390 Urinary tract infection, site not specified: Secondary | ICD-10-CM | POA: Diagnosis not present

## 2014-03-12 DIAGNOSIS — F1010 Alcohol abuse, uncomplicated: Secondary | ICD-10-CM | POA: Diagnosis present

## 2014-03-12 DIAGNOSIS — D62 Acute posthemorrhagic anemia: Secondary | ICD-10-CM | POA: Diagnosis not present
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
## 2014-03-12 NOTE — Progress Notes (Signed)
Beverlee Wilmarth, PT DPT  [phone]  
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
## 2014-03-12 NOTE — Progress Notes (Signed)
Patient much better today.  Wonder if she can be re-evaluated for Rehab.  No surgery on her calcaneus fracture planned.  This patient has been seen and I agree with the findings and treatment plan.  Marta LamasJames O. Gae BonWyatt, III, MD, FACS [phone] (pager) [phone] (direct pager) Trauma Surgeon

## 2014-03-12 NOTE — Progress Notes (Signed)
Orthopedic Tech Progress Note Patient Details:  Brooke HippVickie Estrada 1950/08/04 [id number]  Ortho Devices Type of Ortho Device: CAM walker Ortho Device/Splint Location: lle Ortho Device/Splint Interventions: Application   Cammer, Mickie BailJennifer Carol 03/12/2014, 3:05 PM
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
## 2014-03-12 NOTE — Clinical Social Work Note (Signed)
CSW met with the pt and husband by the bedside. CSW introduce self and purpose of visit. CSW explained that there are no bed offers in the [place] area. CSW explained that and extended search to surrounding counties can be done. Pt's husband expressed concern regarding extending the search, but understand that no beds offers has been made from his area. CSW will continue to assist and aid in discharge.   [place], MSW, [place]
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
## 2014-03-13 MED ORDER — BETHANECHOL CHLORIDE 25 MG PO TABS
25.0000 mg | ORAL_TABLET | Freq: Three times a day (TID) | ORAL | Status: DC
  Administered 2014-03-13 – 2014-03-14 (×4): 25 mg via ORAL
  Filled 2014-03-13 (×4): qty 1

## 2014-03-13 MED ORDER — CIPROFLOXACIN HCL 500 MG PO TABS
500.0000 mg | ORAL_TABLET | Freq: Two times a day (BID) | ORAL | Status: DC
  Administered 2014-03-13 – 2014-03-14 (×3): 500 mg via ORAL
  Filled 2014-03-13 (×5): qty 1
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
## 2014-03-13 NOTE — Clinical Social Work Note (Signed)
CSW called patients husband to offer bed choice.  Left message.  CSW will continue to follow.  Merlyn LotJenna Holoman, LCSWA Clinical Social Worker [phone]

## 2014-03-13 NOTE — Progress Notes (Signed)
I met with pt, her spouse, and daughter at bedside while pt working with therapy. Pt not at a level to be able to tolerate intense therapies, nor will [facility] approve admission with her current level of functioning. Having a lot of difficulties with maintaining NWB status due to r knee and back issues. Pt and family all in agreement to SNF rehab for a prolonged rehab recovery. I have left a message for SW of my recomendation. For SNF. We will sign off. [phone]

## 2014-03-13 NOTE — Progress Notes (Addendum)
I will speak with pt and family and then pursue Insurance approval for a possible inpt rehab admission. I question if it will be approved. [phone]
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
## 2014-03-13 NOTE — Progress Notes (Signed)
Much better today.  This patient has been seen and I agree with the findings and treatment plan.  Marta LamasJames O. Gae BonWyatt, III, MD, FACS [phone] (pager) [phone] (direct pager) Trauma Surgeon

## 2014-03-14 MED ORDER — TRAMADOL HCL 50 MG PO TABS: 50.0000 mg | ORAL_TABLET | Freq: Four times a day (QID) | ORAL | Status: AC | PRN

## 2014-03-14 MED ORDER — CIPROFLOXACIN HCL 500 MG PO TABS: 500.0000 mg | ORAL_TABLET | Freq: Two times a day (BID) | ORAL | Status: AC

## 2014-03-14 MED ORDER — ACETAMINOPHEN 325 MG PO TABS: 650.0000 mg | ORAL_TABLET | ORAL | Status: AC | PRN
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
## 2014-03-14 NOTE — Progress Notes (Signed)
Discharge to [facility].

## 2014-03-14 NOTE — Discharge Summary (Signed)
Okay for patient to leave today for SNF This patient has been seen and I agree with the findings and treatment plan.  Marta LamasJames O. Gae BonWyatt, III, MD, FACS [phone] (pager) [phone] (direct pager) Trauma Surgeon
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
## 2014-03-14 NOTE — Clinical Social Work Note (Signed)
Patient will [facility] Report [phone] Anticipated discharge date:03/14/14 Family notified: Mr. Manson PasseyBrown (spouse) Transportation by [facility]  CSW signing off.  Merlyn LotJenna Holoman, LCSWA Clinical Social Worker [phone]
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
## 2020-04-13 DEATH — deceased
